# Patient Record
Sex: Female | Born: 2000 | Hispanic: Yes | Marital: Single | Smoking: Never smoker
Health system: Southern US, Community
[De-identification: ages and names within clinical notes are randomized; demographics above are authoritative.]

## PROBLEM LIST (undated history)

## (undated) HISTORY — PX: NO PAST SURGERIES: SHX2092

---
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
## 2018-01-05 ENCOUNTER — Ambulatory Visit: Payer: BLUE CROSS/BLUE SHIELD | Attending: Orthopaedic Surgery | Admitting: Physical Therapy

## 2018-01-05 ENCOUNTER — Encounter: Payer: Self-pay | Admitting: Physical Therapy

## 2018-01-05 ENCOUNTER — Other Ambulatory Visit: Payer: Self-pay

## 2018-01-05 DIAGNOSIS — G8929 Other chronic pain: Secondary | ICD-10-CM

## 2018-01-05 DIAGNOSIS — M545 Low back pain: Secondary | ICD-10-CM | POA: Diagnosis present

## 2018-01-05 NOTE — Therapy (Addendum)
[facility] [place], [place], [address] Phone: [phone]   Fax:  [phone]  Physical Therapy Evaluation/Discharge  Patient Details  Name: Stacy Sullivan MRN: [id number] Date of Birth: December 29, 2000 Referring Provider: Rodell Perna   Encounter Date: 01/05/2018  PT End of Session - 01/05/18 1843    Visit Number  1    Number of Visits  12    Date for PT Re-Evaluation  02/23/18    Authorization Type  Progress note every 10th visit    PT Start Time  1348    PT Stop Time  1430    PT Time Calculation (min)  42 min    Activity Tolerance  Patient tolerated treatment well    Behavior During Therapy  [facility] for tasks assessed/performed       History reviewed. No pertinent past medical history.  Past Surgical History:  Procedure Laterality Date  . NO PAST SURGERIES      There were no vitals filed for this visit.   Subjective Assessment - 01/05/18 1826    Subjective  Patient arrives with sister with reports of insidious low back pain, right > left, that began a couple months ago. Patient reported she has always had lower back pain however patient experienced more pain during gym with exercise and running. Patient reported having chiropractic treatment and massage therapy to which helped temporarily. Patient reports intermittent, quick, sharp pains but reports pain is at a constant 1-2/10. Patient reports pain with standing and walking especially at work. Patient reports also pain with sitting during school. Patient reported doctor told her to not lift heavy objects and take gym lightly until further notice. Patient's goals are to decrease pain in order to perform job duties and sit at school without interruptions of pain.     Patient is accompained by:  Family member Sister    Pertinent History  Scoliosis, Spina bifida occulta    Limitations  Standing;Sitting    How long can you sit comfortably?  1.5 hours    How long can you stand  comfortably?  3-4 hours    Diagnostic tests  x-ray: scoliosis and spina bifida occulta at L5    Patient Stated Goals  get rid of pain, stand without pain.    Currently in Pain?  Yes    Pain Score  2     Pain Location  Back    Pain Orientation  Right;Lower    Pain Descriptors / Indicators  Sore;Sharp    Pain Type  Acute pain    Pain Onset  More than a month ago    Pain Frequency  Intermittent    Aggravating Factors   exercise, running, lifting weights, cold    Pain Relieving Factors  ibuprofen and rest    Effect of Pain on Daily Activities  none         [facility] PT Assessment - 01/05/18 0001      Assessment   Medical Diagnosis  Chronic low back pain    Referring Provider  Rodell Perna    Next MD Visit  June 2019    Prior Therapy  No      Balance Screen   Has the patient fallen in the past 6 months  No    Has the patient had a decrease in activity level because of a fear of falling?   No    Is the patient reluctant to leave their home because of a fear of falling?   No  [place]  Private residence    Living Arrangements  Spouse/significant other;Children    [place] to enter    Entrance Stairs-Number of Steps  10    Entrance Stairs-Rails  Can reach both      Prior Function   Vocation  Student;Part time employment    Vocation Requirements  hostess: walking standing      ROM / Strength   AROM / PROM / Strength  AROM;Strength      AROM   AROM Assessment Site  Lumbar    Lumbar Flexion  can touch toes    Lumbar Extension  limited with right trunk rotation    Lumbar - Right Side Bend  WFL    Lumbar - Left Side [facility]      Strength   Strength Assessment Site  Knee;Hip    Right/Left Hip  Right;Left    Right Hip Flexion  4-/5    Right Hip Extension  4/5    Right Hip ABduction  4+/5    Left Hip Flexion  4-/5    Left Hip Extension  4/5    Left Hip ABduction  4+/5    Right/Left Knee  Right;Left    Right Knee Flexion  4/5     Right Knee Extension  5/5    Left Knee Flexion  4/5    Left Knee Extension  5/5      Palpation   SI assessment   Posteriorly rotated right innonimate    Palpation comment  very tender to palpation along L4-S1 spinous processes, bilateral upper gluteals, bilateral PSIS      Special Tests    Special Tests  Sacrolliac Tests    Sacroiliac Tests   Sacral Thrust      Sacral thrust    Findings  Positive    Comments  some tenderness to thrust      Ambulation/Gait   Gait Pattern  Within Functional Limits                Objective measurements completed on examination: See above findings.              PT Education - 01/05/18 1842    Education provided  Yes    Education Details  draw ins, bridging, standing lat flexion stretch, 3 way prayer stretch    Person(s) Educated  Patient    Methods  Explanation;Demonstration;Handout    Comprehension  Verbalized understanding;Returned demonstration          PT Long Term Goals - 01/05/18 1907      PT LONG TERM GOAL #1   Title  Patient will be independent with HEP.    Time  6    Period  Weeks    Status  New      PT LONG TERM GOAL #2   Title  Patient will demonstrate 5/5 hip strength bilaterally in all planes to improve stability during functional and recreational activities.    Time  6    Period  Weeks    Status  New      PT LONG TERM GOAL #3   Title  Patient will report ablilty to stand/walk for a whole work shift (4-5 hours) with less than 3/10 pain in low back.    Time  6    Period  Weeks    Status  New      PT LONG TERM GOAL #4   Title  Patient will report  ability to exercise and run for at least one hour with less than 3/10 low back pain in order to maintain health and fitness.    Time  6    Period  Weeks    Status  New             Plan - 01/05/18 1859    Clinical Impression Statement  Patient is an 17 year old female who presents to physical therapy with low back pain, right > left. Patient  denies numbness and tingling. Patient demonstrates [facility] lumbar AROM however patient noted with right trunk rotation with lumbar extension. Patient noted good LE strength with no reports of pain. Patient noted with increased lumbar lordosis and forward head and rounded shoulders during sitting and standing. Patient very tender to palpation along L4-S1 spinous processes, bilateral lumbar paraspinals, upper gluteals, and bilateral PSIS. Patient noted with unequal leg lengths and unequal ASIS which may be indicative of SI joint dysfunction. Patient would benefit from skilled physical therapy to address deficits and address patient's goals.     History and Personal Factors relevant to plan of care:  Scoliosis (R curve), Spina bifida occulta at L5    Clinical Presentation  Stable    Clinical Decision Making  Low    Rehab Potential  Good    PT Frequency  2x / week    PT Duration  6 weeks    PT Treatment/Interventions  ADLs/Self Care Home Management;Iontophoresis 59m/ml Dexamethasone;Moist Heat;Electrical Stimulation;Cryotherapy;Ultrasound;Gait training;Stair training;Neuromuscular re-education;Patient/family education;Therapeutic exercise;Therapeutic activities;Taping;Dry needling;Manual techniques;Passive range of motion    PT Next Visit Plan  bike or elliptical, core strengthening, STW/M and modalities to reduce pain    Consulted and Agree with Plan of Care  Patient       Patient will benefit from skilled therapeutic intervention in order to improve the following deficits and impairments:  Pain, Decreased activity tolerance, Decreased endurance, Postural dysfunction  Visit Diagnosis: Chronic bilateral low back pain, with sciatica presence unspecified     Problem List Patient Active Problem List   Diagnosis Date Noted  . Chronic bilateral low back pain without sciatica 11/08/2017  . Spina bifida occulta 11/08/2017  . Scoliosis of lumbar spine 11/08/2017   PHYSICAL THERAPY DISCHARGE  SUMMARY  Visits from Start of Care: 1  Current functional level related to goals / functional outcomes: See above   Remaining deficits: Evaluation only, no goals met   Education / Equipment: HEP Plan: Patient agrees to discharge.  Patient goals were not met. Patient is being discharged due to not returning since the last visit.  ?????       KGabriela Eves PT, DPT 01/05/2018, 7:13 PM  [facility] [address]Phone: [phone]  Fax:  [phone] Name: ILissie HinesleyMRN: [id number] of Birth: 1February 28, 2002

## 2019-07-31 ENCOUNTER — Other Ambulatory Visit: Payer: Self-pay

## 2019-07-31 DIAGNOSIS — Z20822 Contact with and (suspected) exposure to covid-19: Secondary | ICD-10-CM

## 2019-08-02 LAB — NOVEL CORONAVIRUS, NAA: SARS-CoV-2, NAA: DETECTED — AB

## 2021-03-14 ENCOUNTER — Encounter: Payer: Self-pay | Admitting: Nurse Practitioner

## 2021-03-14 ENCOUNTER — Other Ambulatory Visit: Payer: Self-pay

## 2021-03-14 ENCOUNTER — Ambulatory Visit (INDEPENDENT_AMBULATORY_CARE_PROVIDER_SITE_OTHER): Payer: 59 | Admitting: Nurse Practitioner

## 2021-03-14 DIAGNOSIS — Z0001 Encounter for general adult medical examination with abnormal findings: Secondary | ICD-10-CM

## 2021-03-14 DIAGNOSIS — Z23 Encounter for immunization: Secondary | ICD-10-CM | POA: Diagnosis not present

## 2021-03-14 DIAGNOSIS — Z0184 Encounter for antibody response examination: Secondary | ICD-10-CM

## 2021-03-14 DIAGNOSIS — Z0000 Encounter for general adult medical examination without abnormal findings: Secondary | ICD-10-CM

## 2021-03-14 NOTE — Assessment & Plan Note (Signed)
Completed test results pending.

## 2021-03-14 NOTE — Assessment & Plan Note (Addendum)
Completed physical exam head to toe assessment.  No new concerns.  Provided education to patient with printed handouts given on health maintenance and preventative care.  Patient verbalized understanding.  Follow-up in 1 year.  Labs completed-CBC, CMP, lipid panel, results pending.  All school forms completed and signed.

## 2021-03-14 NOTE — Patient Instructions (Signed)
Health Maintenance, Female Adopting a healthy lifestyle and getting preventive care are important in promoting health and wellness. Ask your health care provider about: The right schedule for you to have regular tests and exams. Things you can do on your own to prevent diseases and keep yourself healthy. What should I know about diet, weight, and exercise? Eat a healthy diet  Eat a diet that includes plenty of vegetables, fruits, low-fat dairy products, and lean protein. Do not eat a lot of foods that are high in solid fats, added sugars, or sodium.  Maintain a healthy weight Body mass index (BMI) is used to identify weight problems. It estimates body fat based on height and weight. Your health care provider can help determineyour BMI and help you achieve or maintain a healthy weight. Get regular exercise Get regular exercise. This is one of the most important things you can do for your health. Most adults should: Exercise for at least 150 minutes each week. The exercise should increase your heart rate and make you sweat (moderate-intensity exercise). Do strengthening exercises at least twice a week. This is in addition to the moderate-intensity exercise. Spend less time sitting. Even light physical activity can be beneficial. Watch cholesterol and blood lipids Have your blood tested for lipids and cholesterol at 22 years of age, then havethis test every 5 years. Have your cholesterol levels checked more often if: Your lipid or cholesterol levels are high. You are older than 22 years of age. You are at high risk for heart disease. What should I know about cancer screening? Depending on your health history and family history, you may need to have cancer screening at various ages. This may include screening for: Breast cancer. Cervical cancer. Colorectal cancer. Skin cancer. Lung cancer. What should I know about heart disease, diabetes, and high blood pressure? Blood pressure and heart  disease High blood pressure causes heart disease and increases the risk of stroke. This is more likely to develop in people who have high blood pressure readings, are of African descent, or are overweight. Have your blood pressure checked: Every 3-5 years if you are 18-39 years of age. Every year if you are 40 years old or older. Diabetes Have regular diabetes screenings. This checks your fasting blood sugar level. Have the screening done: Once every three years after age 40 if you are at a normal weight and have a low risk for diabetes. More often and at a younger age if you are overweight or have a high risk for diabetes. What should I know about preventing infection? Hepatitis B If you have a higher risk for hepatitis B, you should be screened for this virus. Talk with your health care provider to find out if you are at risk forhepatitis B infection. Hepatitis C Testing is recommended for: Everyone born from 1945 through 1965. Anyone with known risk factors for hepatitis C. Sexually transmitted infections (STIs) Get screened for STIs, including gonorrhea and chlamydia, if: You are sexually active and are younger than 22 years of age. You are older than 22 years of age and your health care provider tells you that you are at risk for this type of infection. Your sexual activity has changed since you were last screened, and you are at increased risk for chlamydia or gonorrhea. Ask your health care provider if you are at risk. Ask your health care provider about whether you are at high risk for HIV. Your health care provider may recommend a prescription medicine to help   prevent HIV infection. If you choose to take medicine to prevent HIV, you should first get tested for HIV. You should then be tested every 3 months for as long as you are taking the medicine. Pregnancy If you are about to stop having your period (premenopausal) and you may become pregnant, seek counseling before you get  pregnant. Take 400 to 800 micrograms (mcg) of folic acid every day if you become pregnant. Ask for birth control (contraception) if you want to prevent pregnancy. Osteoporosis and menopause Osteoporosis is a disease in which the bones lose minerals and strength with aging. This can result in bone fractures. If you are 65 years old or older, or if you are at risk for osteoporosis and fractures, ask your health care provider if you should: Be screened for bone loss. Take a calcium or vitamin D supplement to lower your risk of fractures. Be given hormone replacement therapy (HRT) to treat symptoms of menopause. Follow these instructions at home: Lifestyle Do not use any products that contain nicotine or tobacco, such as cigarettes, e-cigarettes, and chewing tobacco. If you need help quitting, ask your health care provider. Do not use street drugs. Do not share needles. Ask your health care provider for help if you need support or information about quitting drugs. Alcohol use Do not drink alcohol if: Your health care provider tells you not to drink. You are pregnant, may be pregnant, or are planning to become pregnant. If you drink alcohol: Limit how much you use to 0-1 drink a day. Limit intake if you are breastfeeding. Be aware of how much alcohol is in your drink. In the [place] one drink equals one 12 oz bottle of beer (355 mL), one 5 oz glass of wine (148 mL), or one 1 oz glass of hard liquor (44 mL). General instructions Schedule regular health, dental, and eye exams. Stay current with your vaccines. Tell your health care provider if: You often feel depressed. You have ever been abused or do not feel safe at home. Summary Adopting a healthy lifestyle and getting preventive care are important in promoting health and wellness. Follow your health care provider's instructions about healthy diet, exercising, and getting tested or screened for diseases. Follow your health care provider's  instructions on monitoring your cholesterol and blood pressure. This information is not intended to replace advice given to you by your health care provider. Make sure you discuss any questions you have with your healthcare provider. Document Revised: 08/17/2018 Document Reviewed: 08/17/2018 Elsevier Patient Education  2022 Elsevier Inc.  

## 2021-03-14 NOTE — Progress Notes (Addendum)
New Patient Note  RE: Tyliyah Mcmeekin MRN: [id number] DOB: [birth year] Date of Office Visit: 03/14/2021  Chief Complaint: Annual Exam  History of Present Illness: .   Encounter for general adult medical examination without abnormal findings  Physical: Patient's last physical exam was 1 year ago .  Weight: Appropriate for height (BMI less than 27%) ; yes Blood Pressure: Normal (BP less than 120/80) ; yes Medical History: Patient history reviewed ; Family history reviewed ;  Allergies Reviewed: No change in current allergies ; no changes Medications Reviewed: Medications reviewed - no changes ;  Lipids: Normal lipid levels ; labs completed results pending Smoking: Life-long non-smoker ; non-smoker Physical Activity: Exercises at least 3 times per week ;  Alcohol/Drug Use: Is a non-drinker ; No illicit drug use ; no drug use Patient is not afflicted from Stress Incontinence and Urge Incontinence  Safety: reviewed ; Patient wears a seat belt, has smoke detectors, has carbon monoxide detectors, practices appropriate gun safety, and wears sunscreen with extended sun exposure. Dental Care: biannual cleanings, brushes and flosses daily. Ophthalmology/Optometry: Annual visit.  Hearing loss: none Vision impairments: none  Assessment and Plan: Alfredo is a 20 y.o. female with: Annual physical exam Completed physical exam head to toe assessment.  No new concerns.  Provided education to patient with printed handouts given on health maintenance and preventative care.  Patient verbalized understanding.  Follow-up in 1 year.  Labs completed-CBC, CMP, lipid panel, results pending.  All school forms completed and signed.  Encounter for antibody response examination Completed test results pending.   Return in about 1 year (around 03/14/2022).   Diagnostics:   Past Medical History: Patient Active Problem List   Diagnosis Date Noted   Annual physical exam 03/14/2021   Encounter for  antibody response examination 03/14/2021   Chronic bilateral low back pain without sciatica 11/08/2017   Spina bifida occulta 11/08/2017   Scoliosis of lumbar spine 11/08/2017   History reviewed. No pertinent past medical history. Past Surgical History: Past Surgical History:  Procedure Laterality Date   NO PAST SURGERIES     Medication List:  Current Outpatient Medications  Medication Sig Dispense Refill   meloxicam (MOBIC) 7.5 MG tablet TAKE 1 TABLET BY MOUTH EVERY DAY (Patient not taking: Reported on 01/05/2018) 30 tablet 0   No current facility-administered medications for this visit.   Allergies: No Known Allergies Social History: Social History   Socioeconomic History   Marital status: Single    Spouse name: Not on file   Number of children: Not on file   Years of education: Not on file   Highest education level: Not on file  Occupational History   Not on file  Tobacco Use   Smoking status: Never   Smokeless tobacco: Never  Vaping Use   Vaping Use: Never used  Substance and Sexual Activity   Alcohol use: No   Drug use: No   Sexual activity: Never    Birth control/protection: Abstinence  Other Topics Concern   Not on file  Social History Narrative   Not on file   Social Determinants of Health   Financial Resource Strain: Not on file  Food Insecurity: Not on file  Transportation Needs: Not on file  Physical Activity: Not on file  Stress: Not on file  Social Connections: Not on file       Family History: Family History  Problem Relation Age of Onset   Healthy Mother    Healthy Father  Healthy Sister    Healthy Brother    Healthy Sister    Healthy Brother    Healthy Brother    Ankylosing spondylitis Neg Hx    Rheum arthritis Neg Hx    Juvenile idiopathic arthritis Neg Hx      Review of Systems  Constitutional: Negative.   HENT: Negative.    Eyes: Negative.   Respiratory: Negative.    Cardiovascular: Negative.   Gastrointestinal:  Negative.   Musculoskeletal: Negative.   Skin: Negative.  Negative for rash.  Neurological: Negative.   All other systems reviewed and are negative. Objective: There were no vitals taken for this visit. There is no height or weight on file to calculate BMI. Physical Exam Vitals and nursing note reviewed.  Constitutional:      Appearance: Normal appearance. She is normal weight.  HENT:     Head: Normocephalic.     Nose: Nose normal.     Mouth/Throat:     Mouth: Mucous membranes are moist.     Pharynx: Oropharynx is clear.  Eyes:     Conjunctiva/sclera: Conjunctivae normal.     Pupils: Pupils are equal, round, and reactive to light.  Cardiovascular:     Rate and Rhythm: Normal rate and regular rhythm.     Pulses: Normal pulses.     Heart sounds: Normal heart sounds.  Pulmonary:     Effort: Pulmonary effort is normal.     Breath sounds: Normal breath sounds.  Abdominal:     General: Abdomen is flat. Bowel sounds are normal.  Musculoskeletal:        General: Normal range of motion.  Skin:    General: Skin is warm.     Findings: No rash.  Neurological:     Mental Status: She is alert and oriented to person, place, and time.  Psychiatric:        Behavior: Behavior normal.   Annual physical exam Completed physical exam head to toe assessment.  No new concerns.  Provided education to patient with printed handouts given on health maintenance and preventative care.  Patient verbalized understanding.  Follow-up in 1 year.  Labs completed-CBC, CMP, lipid panel, results pending.  All school forms completed and signed.  Encounter for antibody response examination Completed test results pending.      The plan was reviewed with the patient/family, and all questions/concerned were addressed.  It was my pleasure to see Dimitria today and participate in her care. Please feel free to contact me with any questions or concerns.  Sincerely,  Lynnell Chad NP [facility]

## 2021-03-19 ENCOUNTER — Other Ambulatory Visit: Payer: Self-pay

## 2021-03-19 ENCOUNTER — Ambulatory Visit (INDEPENDENT_AMBULATORY_CARE_PROVIDER_SITE_OTHER): Payer: 59 | Admitting: *Deleted

## 2021-03-19 DIAGNOSIS — Z23 Encounter for immunization: Secondary | ICD-10-CM

## 2021-03-20 LAB — CBC WITH DIFFERENTIAL/PLATELET
Basophils Absolute: 0.1 10*3/uL (ref 0.0–0.2)
Basos: 1 %
EOS (ABSOLUTE): 0.1 10*3/uL (ref 0.0–0.4)
Eos: 2 %
Hematocrit: 44.5 % (ref 34.0–46.6)
Hemoglobin: 14.5 g/dL (ref 11.1–15.9)
Immature Grans (Abs): 0 10*3/uL (ref 0.0–0.1)
Immature Granulocytes: 0 %
Lymphocytes Absolute: 1.9 10*3/uL (ref 0.7–3.1)
Lymphs: 30 %
MCH: 28.7 pg (ref 26.6–33.0)
MCHC: 32.6 g/dL (ref 31.5–35.7)
MCV: 88 fL (ref 79–97)
Monocytes Absolute: 0.5 10*3/uL (ref 0.1–0.9)
Monocytes: 7 %
Neutrophils Absolute: 3.8 10*3/uL (ref 1.4–7.0)
Neutrophils: 60 %
Platelets: 333 10*3/uL (ref 150–450)
RBC: 5.06 x10E6/uL (ref 3.77–5.28)
RDW: 13.1 % (ref 11.7–15.4)
WBC: 6.4 10*3/uL (ref 3.4–10.8)

## 2021-03-20 LAB — COMPREHENSIVE METABOLIC PANEL
ALT: 11 IU/L (ref 0–32)
AST: 18 IU/L (ref 0–40)
Albumin/Globulin Ratio: 1.6 (ref 1.2–2.2)
Albumin: 4.6 g/dL (ref 3.9–5.0)
Alkaline Phosphatase: 65 IU/L (ref 44–121)
BUN/Creatinine Ratio: 15 (ref 9–23)
BUN: 10 mg/dL (ref 6–20)
Bilirubin Total: 0.2 mg/dL (ref 0.0–1.2)
CO2: 24 mmol/L (ref 20–29)
Calcium: 10 mg/dL (ref 8.7–10.2)
Chloride: 103 mmol/L (ref 96–106)
Creatinine, Ser: 0.68 mg/dL (ref 0.57–1.00)
Globulin, Total: 2.8 g/dL (ref 1.5–4.5)
Glucose: 85 mg/dL (ref 65–99)
Potassium: 5.2 mmol/L (ref 3.5–5.2)
Sodium: 141 mmol/L (ref 134–144)
Total Protein: 7.4 g/dL (ref 6.0–8.5)
eGFR: 127 mL/min/{1.73_m2} (ref >59)

## 2021-03-20 LAB — QUANTIFERON-TB GOLD PLUS
QuantiFERON Mitogen Value: >10 IU/mL
QuantiFERON Nil Value: 0 IU/mL
QuantiFERON TB1 Ag Value: 0.02 IU/mL
QuantiFERON TB2 Ag Value: 0.03 IU/mL
QuantiFERON-TB Gold Plus: NEGATIVE

## 2021-03-20 LAB — LIPID PANEL
Chol/HDL Ratio: 3.9 ratio (ref 0.0–4.4)
Cholesterol, Total: 179 mg/dL (ref 100–199)
HDL: 46 mg/dL (ref >39)
LDL Chol Calc (NIH): 120 mg/dL — ABNORMAL HIGH (ref 0–99)
Triglycerides: 67 mg/dL (ref 0–149)
VLDL Cholesterol Cal: 13 mg/dL (ref 5–40)

## 2021-03-20 LAB — MEASLES/MUMPS/RUBELLA IMMUNITY
MUMPS ABS, IGG: 24.2 AU/mL (ref >10.9)
RUBEOLA AB, IGG: <13.5 AU/mL — ABNORMAL LOW (ref >16.4)
Rubella Antibodies, IGG: 1.01 index (ref >0.99)

## 2021-03-20 LAB — VARICELLA ZOSTER ANTIBODY, IGG: Varicella zoster IgG: 264 index (ref >165)

## 2021-12-30 ENCOUNTER — Encounter: Payer: Self-pay | Admitting: Nurse Practitioner

## 2021-12-30 ENCOUNTER — Ambulatory Visit (INDEPENDENT_AMBULATORY_CARE_PROVIDER_SITE_OTHER): Payer: 59 | Admitting: Nurse Practitioner

## 2021-12-30 ENCOUNTER — Ambulatory Visit (INDEPENDENT_AMBULATORY_CARE_PROVIDER_SITE_OTHER): Payer: 59

## 2021-12-30 ENCOUNTER — Other Ambulatory Visit: Payer: Self-pay | Admitting: Nurse Practitioner

## 2021-12-30 VITALS — BP 99/63 | HR 65 | Temp 98.1°F | Resp 20 | Ht 66.0 in | Wt 141.0 lb

## 2021-12-30 DIAGNOSIS — R110 Nausea: Secondary | ICD-10-CM

## 2021-12-30 DIAGNOSIS — R1032 Left lower quadrant pain: Secondary | ICD-10-CM

## 2021-12-30 DIAGNOSIS — A09 Infectious gastroenteritis and colitis, unspecified: Secondary | ICD-10-CM | POA: Diagnosis not present

## 2021-12-30 DIAGNOSIS — R300 Dysuria: Secondary | ICD-10-CM

## 2021-12-30 DIAGNOSIS — N926 Irregular menstruation, unspecified: Secondary | ICD-10-CM | POA: Diagnosis not present

## 2021-12-30 LAB — MICROSCOPIC EXAMINATION: Renal Epithel, UA: NONE SEEN /hpf

## 2021-12-30 LAB — URINALYSIS, ROUTINE W REFLEX MICROSCOPIC
Bilirubin, UA: NEGATIVE
Glucose, UA: NEGATIVE
Ketones, UA: NEGATIVE
Nitrite, UA: NEGATIVE
Protein,UA: NEGATIVE
Specific Gravity, UA: 1.025 (ref 1.005–1.030)
Urobilinogen, Ur: 0.2 mg/dL (ref 0.2–1.0)
pH, UA: 5 (ref 5.0–7.5)

## 2021-12-30 LAB — PREGNANCY, URINE: Preg Test, Ur: NEGATIVE

## 2021-12-30 MED ORDER — ONDANSETRON HCL 4 MG PO TABS: 4.0000 mg | ORAL_TABLET | Freq: Three times a day (TID) | ORAL | 0 refills | Status: AC | PRN

## 2021-12-30 MED ORDER — NITROFURANTOIN MONOHYD MACRO 100 MG PO CAPS: 100.0000 mg | ORAL_CAPSULE | Freq: Two times a day (BID) | ORAL | 0 refills | Status: AC

## 2021-12-30 MED ORDER — LOPERAMIDE HCL 2 MG PO TABS: 2.0000 mg | ORAL_TABLET | Freq: Four times a day (QID) | ORAL | 0 refills | Status: AC | PRN

## 2021-12-30 NOTE — Addendum Note (Signed)
Addended by: Magdalene River on: 12/30/2021 03:23 PM ? ? Modules accepted: Orders ? ?

## 2021-12-30 NOTE — Patient Instructions (Addendum)
Ovarian Cyst ?Abnormal Uterine Bleeding ? ?Abnormal uterine bleeding is unusual bleeding from the uterus. It includes bleeding after sex, or bleeding or spotting between menstrual periods. It may also include bleeding that is heavier than normal, menstrual periods that last longer than usual, or bleeding that occurs after menopause. ?Abnormal uterine bleeding can affect teenagers, women in their reproductive years, pregnant women, and women who have reached menopause. Common causes of abnormal uterine bleeding include: ?Pregnancy. ?Abnormal growths within the lining of the uterus (polyps). ?Benign tumors or growths in the uterus (fibroids). These are not cancer. ?Infection. ?Cancer. ?Too much or too little of some hormones in the body (hormonal imbalances). ?Any type of abnormal bleeding should be checked by a health care provider. Many cases are minor and simple to treat, but others may be more serious. Treatment will depend on the cause of the bleeding and how severe it is. ?Follow these instructions at home: ?Medicines ?Take over-the-counter and prescription medicines only as told by your health care provider. ?Ask your health care provider about: ?Taking medicines such as aspirin and ibuprofen. These medicines can thin your blood. Do not take these medicines unless your health care provider tells you to take them. ?Taking over-the-counter medicines, vitamins, herbs, and supplements. ?If you were prescribed iron pills, take them as told by your health care provider. Iron pills help to replace iron that your body loses because of this condition. ?Managing constipation ?In cases of severe bleeding, you may be asked to increase your iron intake to treat anemia. Doing this may cause constipation. To prevent or treat constipation, you may need to: ?Drink enough fluid to keep your urine pale yellow. ?Take over-the-counter or prescription medicines. ?Eat foods that are high in fiber, such as beans, whole grains, and  fresh fruits and vegetables. ?Limit foods that are high in fat and processed sugars, such as fried or sweet foods. ?Activity ?Alter your activity to decrease bleeding if you need to change your sanitary pad more than one time every 2 hours: ?Lie in bed with your feet raised (elevated). ?Place a cold pack on your lower abdomen. ?Rest as much as possible until the bleeding stops or slows down. ?General instructions ?Do not use tampons, douche, or have sex until your health care provider says these things are okay. ?Change your sanitary pads often. ?Get regular exams. These include pelvic exams and cervical cancer screenings. ?It is up to you to get the results of any tests that are done. Ask your health care provider, or the department that is doing the tests, when your results will be ready. ?Monitor your condition for any changes. For 2 months, write down: ?When your menstrual period starts. ?When your menstrual period ends. ?When any abnormal vaginal bleeding occurs. ?What problems you notice. ?Keep all follow-up visits. This is important. ?Contact a health care provider if: ?You have bleeding that lasts for more than one week. ?You feel dizzy at times. ?You feel nauseous or you vomit. ?You feel light-headed or weak. ?You notice any other changes that show that your condition is getting worse. ?Get help right away if: ?You faint. ?You have bleeding that soaks through a sanitary pad every hour. ?You have pain in the abdomen. ?You have a fever or chills. ?You become sweaty or weak. ?You pass large blood clots from your vagina. ?These symptoms may represent a serious problem that is an emergency. Do not wait to see if the symptoms will go away. Get medical help right away. Call your local  emergency services (911 in the [place] Do not drive yourself to the hospital. ?Summary ?Abnormal uterine bleeding is unusual bleeding from the uterus. ?Any type of abnormal bleeding should be checked by a health care provider. Many  cases are minor and simple to treat, but others may be more serious. ?Treatment will depend on the cause of the bleeding and how severe it is. ?Get help right away if you faint, you have bleeding that soaks through a sanitary pad every hour, or you pass large blood clots from your vagina. ?This information is not intended to replace advice given to you by your health care provider. Make sure you discuss any questions you have with your health care provider. ?Document Revised: 12/24/2020 Document Reviewed: 12/24/2020 ?Elsevier Patient Education ? 2023 Elsevier Inc. ? ?An ovarian cyst is a fluid-filled sac that forms on an ovary. The ovaries are small organs that produce eggs in women. Various types of cysts can form on the ovaries. Some may cause symptoms and require treatment. Most ovarian cysts go away on their own, are not cancerous (are benign), and do not cause problems. ?What are the causes? ?Ovarian cysts may be caused by: ?Ovarian hyperstimulation syndrome. This is a condition that can develop from taking fertility medicines. It causes multiple large ovarian cysts to form. ?Polycystic ovarian syndrome (PCOS). This is a common hormonal disorder that can cause ovarian cysts to form, and can cause problems with your period or fertility. ?The normal menstrual cycle. ?What increases the risk? ?The following factors may make you more likely to develop this condition: ?Being overweight or obese. ?Taking fertility medicines. ?Taking certain forms of hormonal birth control. ?Smoking. ?What are the signs or symptoms? ?Many ovarian cysts do not cause symptoms. If symptoms are present, they may include: ?Pelvic pain or pressure. ?Pain in the lower abdomen. ?Pain during sex. ?Abdominal swelling. ?Abnormal menstrual periods. ?Increasing pain with menstrual periods. ?How is this diagnosed? ?These cysts are commonly found during a routine pelvic exam. You may have tests to find out more about the cyst, such  as: ?Ultrasound. ?CT scan. ?MRI. ?Blood tests. ?How is this treated? ?Many ovarian cysts go away on their own without treatment. Your health care provider may want to check your cyst regularly for 2-3 months to see if it changes. If you are in menopause, it is especially important to have your cyst monitored closely because menopausal women have a higher rate of ovarian cancer. ?When treatment is needed, it may include: ?Medicines to help relieve pain. ?A procedure to drain the cyst (aspiration). ?Surgery to remove the whole cyst (cystectomy). ?Hormone treatment or birth control pills. These methods are sometimes used to help keep cysts from coming back. ?Surgery to remove the ovary (oophorectomy). ?Follow these instructions at home: ?Take over-the-counter and prescription medicines only as told by your health care provider. ?Ask your health care provider if any medicine prescribed to you requires you to avoid driving or using machinery. ?Get regular pelvic exams and Pap tests as often as told by your health care provider. ?Return to your normal activities as told by your health care provider. Ask your health care provider what activities are safe for you. ?Do not use any products that contain nicotine or tobacco, such as cigarettes, e-cigarettes, and chewing tobacco. If you need help quitting, ask your health care provider. ?Keep all follow-up visits. This is important. ?Contact a health care provider if: ?Your periods are late, irregular, painful, or they stop. ?You have pelvic pain that does not  go away. ?You have pressure on your bladder or trouble emptying your bladder completely. ?You have any of the following: ?A feeling of fullness. ?You are gaining weight or losing weight without changing your exercise and eating habits. ?Pain, swelling, or bloating in the abdomen. ?Loss of appetite. ?Pain and pressure in your back and pelvis. ?You think you may be pregnant. ?Get help right away if: ?You have abdominal or  pelvic pain that is severe or gets worse. ?You cannot eat or drink without vomiting. ?You suddenly develop a fever or chills. ?Your menstrual period is much heavier than usual. ?Summary ?An ovarian cyst is a fluid-filled Pharmacist, hospital

## 2021-12-30 NOTE — Progress Notes (Signed)
? ? ?Acute Office Visit ? ?Subjective:  ? ?  ?Patient ID: Stacy Sullivan, female    DOB: [birth year], 21 y.o.   MRN: [id number] ? ?Chief Complaint  ?Patient presents with  ? Abdominal Pain  ?  Gi upset and recent menstrual changes   ? ? ?Abdominal Pain ?This is a recurrent problem. The current episode started in the past 7 days. The problem occurs constantly. The problem is unchanged. The pain is located in the generalized abdominal region. The pain is moderate. The quality of the pain is described as aching and cramping. The pain radiates to the RLQ and RUQ. Associated symptoms include diarrhea and nausea. Pertinent negatives include no anorexia, constipation, fever, rash or vomiting. Nothing relieves the symptoms. Past treatments include nothing. The treatment provided no improvement relief.  ?Diarrhea  ?This is a new problem. The current episode started in the past 7 days. The problem occurs 2 to 4 times per day. The problem has been unchanged. The stool consistency is described as Watery. The patient states that diarrhea does not awaken her from sleep. Associated symptoms include abdominal pain. Pertinent negatives include no chills, fever or vomiting. Nothing aggravates the symptoms. She has tried nothing for the symptoms. There is no history of bowel resection, inflammatory bowel disease or irritable bowel syndrome.  ? ? ?Review of Systems  ?Constitutional:  Negative for chills and fever.  ?HENT: Negative.    ?Respiratory: Negative.    ?Cardiovascular: Negative.   ?Gastrointestinal:  Positive for abdominal pain, diarrhea and nausea. Negative for anorexia, constipation and vomiting.  ?Skin: Negative.  Negative for rash.  ?All other systems reviewed and are negative. ? ? ?   ?Objective:  ?  ?BP 99/63   Pulse 65   Temp 98.1 ?F (36.7 ?C)   Resp 20   Ht 5\' 6"  (1.676 m)   Wt 141 lb (64 kg)   LMP 12/24/2021 (Exact Date)   SpO2 100%   BMI 22.76 kg/m?  ?BP Readings from Last 3 Encounters:  ?12/30/21 99/63   ?11/18/17 138/77  ?11/08/17 112/68  ? ?Wt Readings from Last 3 Encounters:  ?12/30/21 141 lb (64 kg)  ?11/18/17 145 lb (65.8 kg) (79 %, Z= 0.81)*  ?11/08/17 145 lb (65.8 kg) (79 %, Z= 0.81)*  ? ?* Growth percentiles are based on CDC (Girls, 2-20 Years) data.  ? ?  ? ?Physical Exam ?Vitals and nursing note reviewed.  ?Constitutional:   ?   Appearance: She is well-developed.  ?HENT:  ?   Head: Normocephalic.  ?Cardiovascular:  ?   Rate and Rhythm: Normal rate and regular rhythm.  ?   Heart sounds: Normal heart sounds.  ?Pulmonary:  ?   Effort: Pulmonary effort is normal.  ?   Breath sounds: Normal breath sounds.  ?Abdominal:  ?   General: Bowel sounds are increased.  ?   Tenderness: There is abdominal tenderness in the right upper quadrant and right lower quadrant. There is no right CVA tenderness, left CVA tenderness or rebound. Negative signs include Murphy's sign and McBurney's sign.  ?Neurological:  ?   Mental Status: She is alert.  ? ? ?No results found for any visits on 12/30/21. ? ? ?   ?Assessment & Plan:  ?Bilateral lower abdominal pain symptoms not well controlled in the past 1 week.  Patient reports nausea and vomiting no fever or chills.  Completed urinalysis, urine pregnancy test results pending, transvaginal ultrasound to rule out ovarian cyst and inflammation, KUB with results pending.  Completed urine pregnancy test. ? ?Brat diet recommended, Tylenol/ibuprofen for pain as needed, increase hydration, Zofran for nausea, Imodium for diarrhea.  Follow-up with worsening unresolved symptoms. ?Problem List Items Addressed This Visit   ?None ?Visit Diagnoses   ? ? Left lower quadrant abdominal pain    -  Primary  ? Relevant Medications  ? loperamide (IMODIUM A-D) 2 MG tablet  ? Other Relevant Orders  ? DG Abd 1 View  ? Pregnancy, urine  ? Urinalysis, Routine w reflex microscopic  ? US Transvaginal Non-OB  ? Nausea      ? Relevant Medications  ? ondansetron (ZOFRAN) 4 MG tablet  ? Other Relevant Orders  ?  Urinalysis, Routine w reflex microscopic  ? Diarrhea of infectious origin      ? Relevant Medications  ? loperamide (IMODIUM A-D) 2 MG tablet  ? Menstruation, irregular      ? ?  ? ? ?Meds ordered this encounter  ?Medications  ? ondansetron (ZOFRAN) 4 MG tablet  ?  Sig: Take 1 tablet (4 mg total) by mouth every 8 (eight) hours as needed for nausea or vomiting.  ?  Dispense:  20 tablet  ?  Refill:  0  ?  Order Specific Question:   Supervising Provider  ?  AnswerMechele Claude [[id number]]  ? loperamide (IMODIUM A-D) 2 MG tablet  ?  Sig: Take 1 tablet (2 mg total) by mouth 4 (four) times daily as needed for diarrhea or loose stools.  ?  Dispense:  30 tablet  ?  Refill:  0  ?  Order Specific Question:   Supervising Provider  ?  AnswerMechele Claude [[id number]]  ? ? ?Return if symptoms worsen or fail to improve. ? ?Daryll Drown, NP ? ? ?

## 2022-01-01 LAB — URINE CULTURE: Organism ID, Bacteria: NO GROWTH

## 2022-01-08 ENCOUNTER — Ambulatory Visit (HOSPITAL_COMMUNITY)
Admission: RE | Admit: 2022-01-08 | Discharge: 2022-01-08 | Disposition: A | Discharge status: Home / Self Care | Payer: 59 | Source: Ambulatory Visit | Attending: Nurse Practitioner | Admitting: Nurse Practitioner

## 2022-01-08 DIAGNOSIS — R1032 Left lower quadrant pain: Secondary | ICD-10-CM | POA: Insufficient documentation

## 2022-01-09 ENCOUNTER — Other Ambulatory Visit (HOSPITAL_COMMUNITY): Payer: BLUE CROSS/BLUE SHIELD

## 2022-10-05 ENCOUNTER — Telehealth: Payer: Self-pay | Admitting: Nurse Practitioner

## 2023-02-02 IMAGING — US US TRANSVAGINAL NON-OB
1 series · 14 of 25 positions shown · non-contrast
Comparison: None Available.

CLINICAL DATA: Right lower quadrant to right upper quadrant pain
for 2 weeks

EXAM:
ULTRASOUND PELVIS TRANSVAGINAL
TECHNIQUE: Transvaginal ultrasound examination of the pelvis was performed
including evaluation of the uterus, ovaries, adnexal regions, and
pelvic cul-de-sac.

[Series 1: us pelvis transvaginal non-ob (tv only) · 14 of 81 slices shown]
[im 1/81]
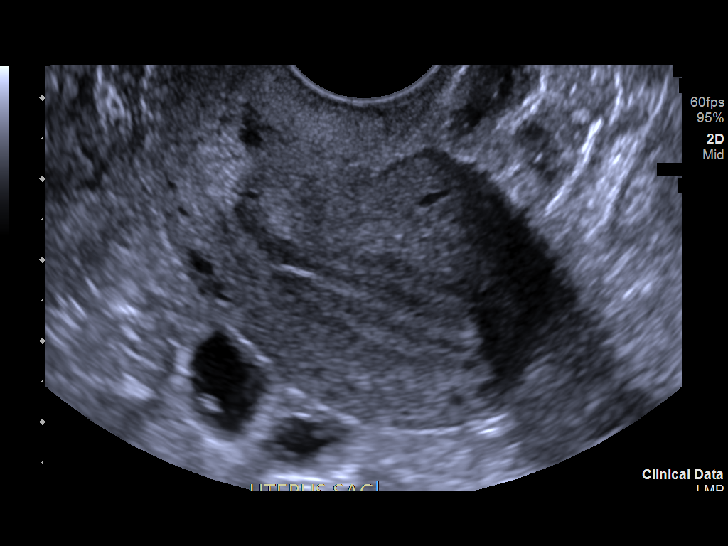
[im 7/81]
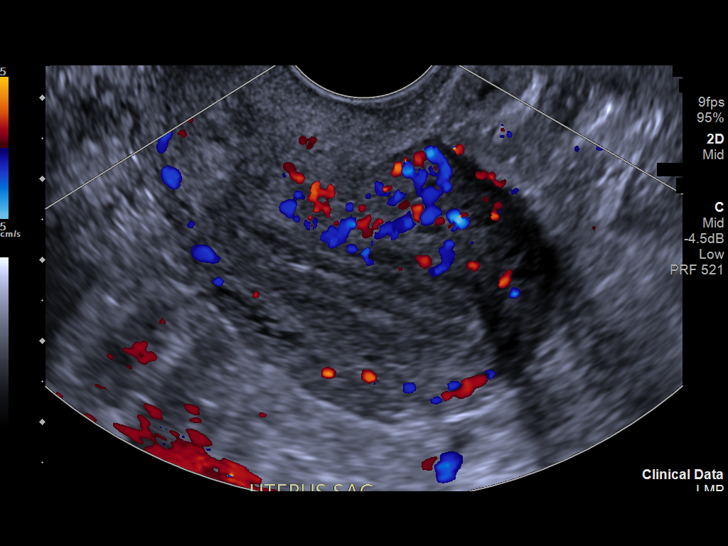
[im 14/81]
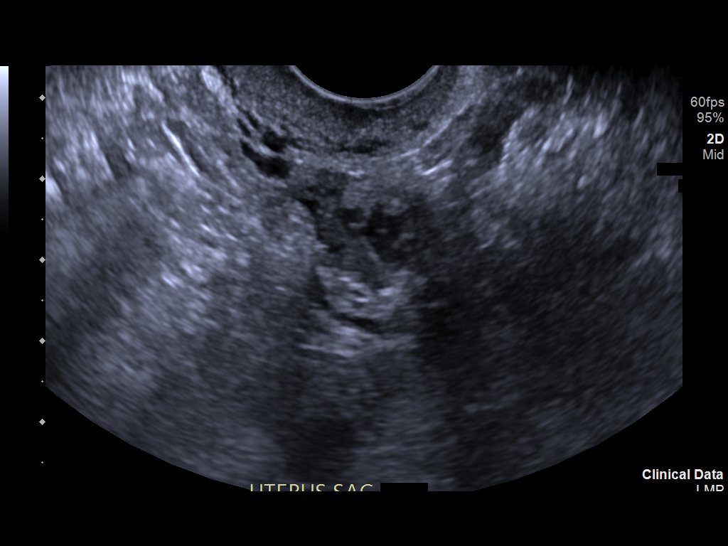
[im 21/81]
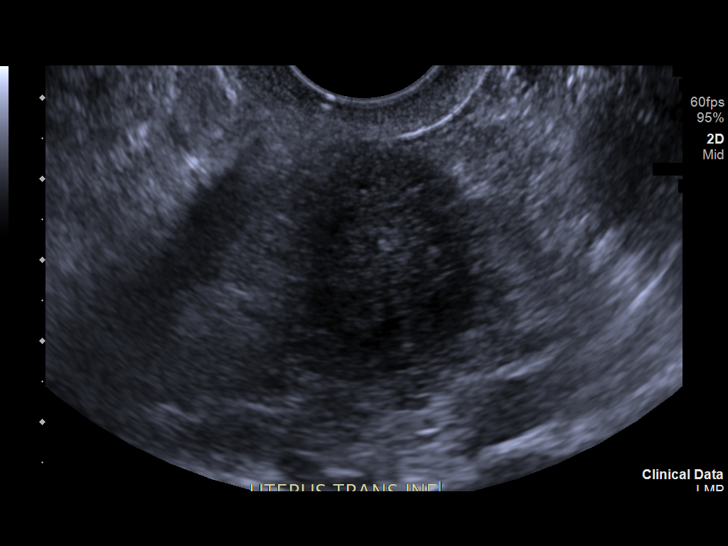
[im 27/81]
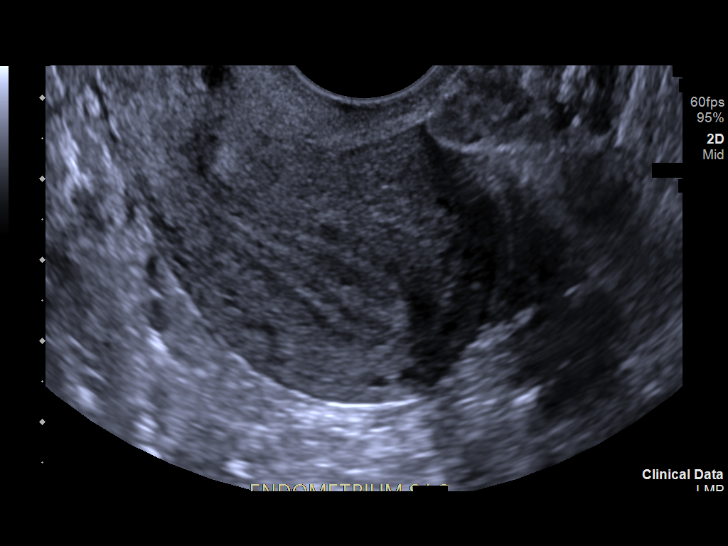
[im 31/81]
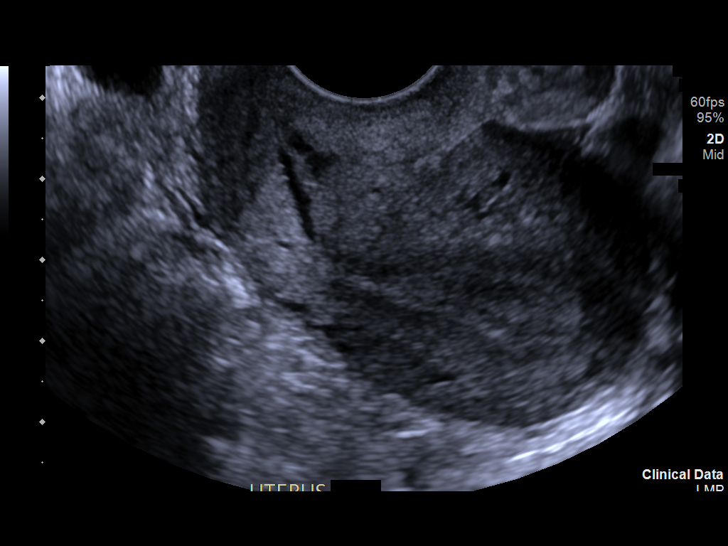
[im 37/81]
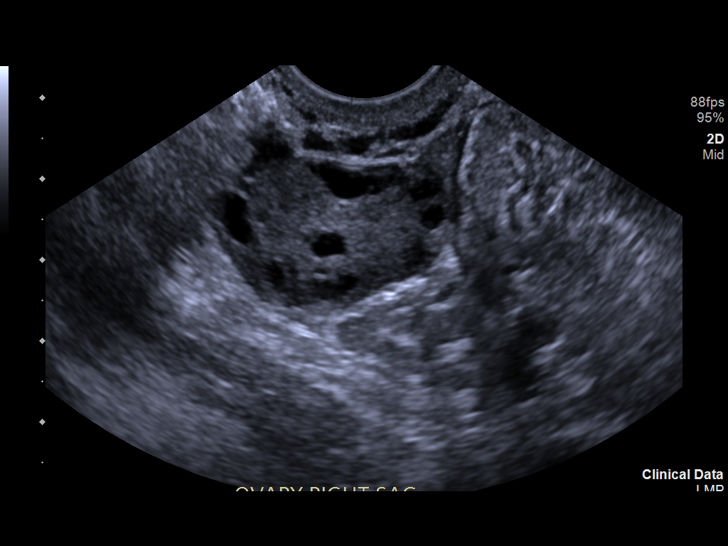
[im 44/81]
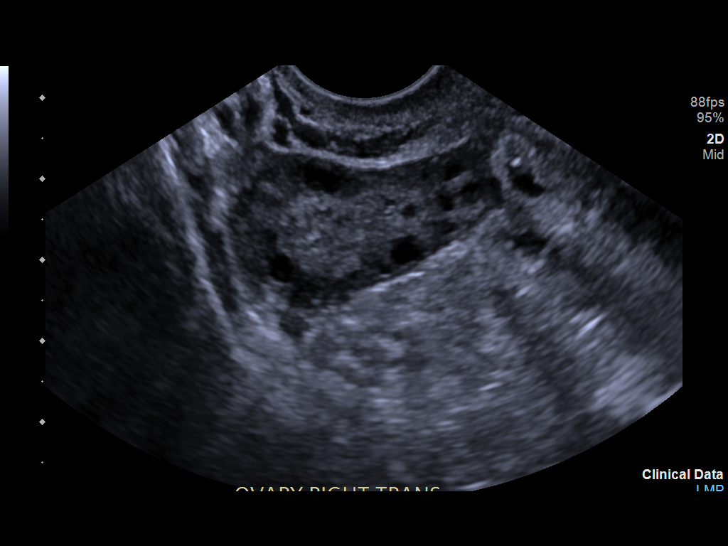
[im 51/81]
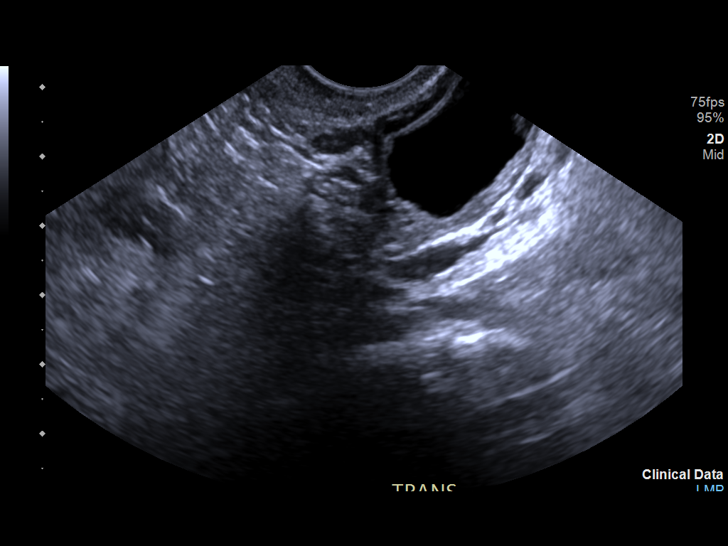
[im 54/81]
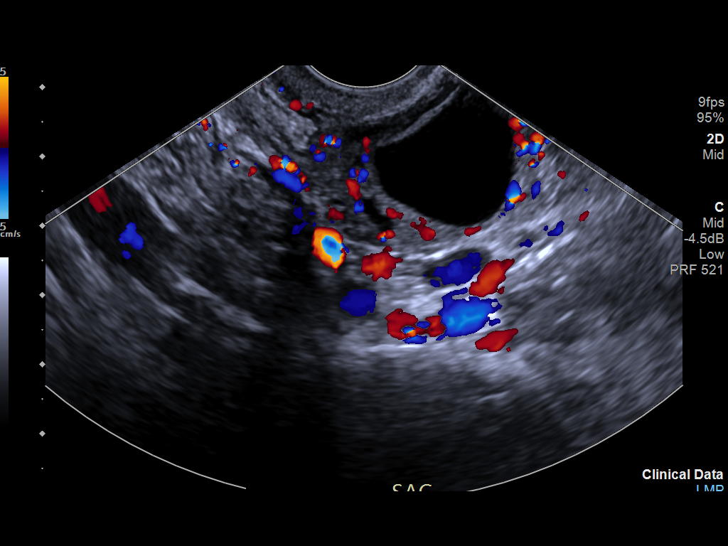
[im 61/81]
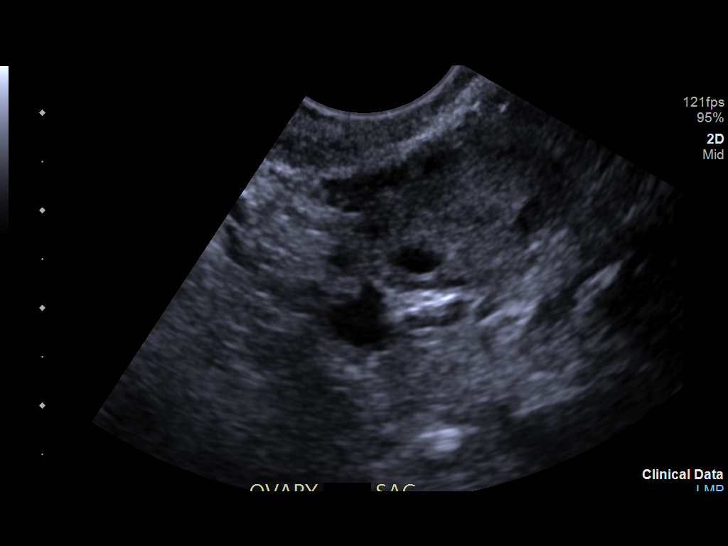
[im 67/81]
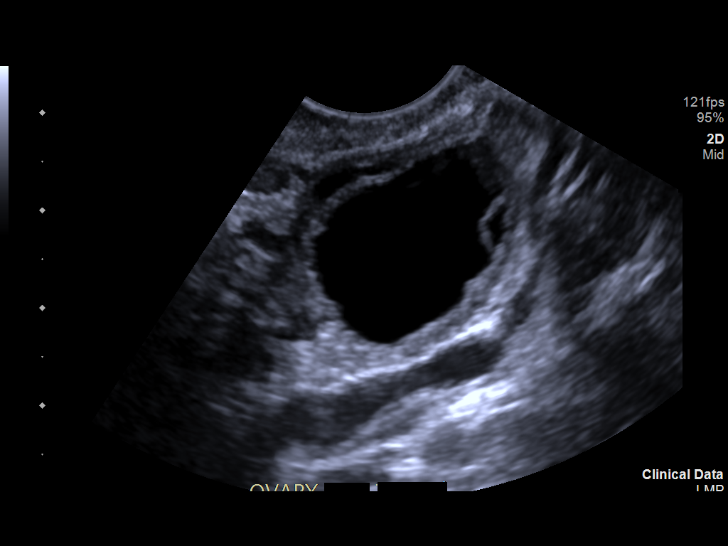
[im 74/81]
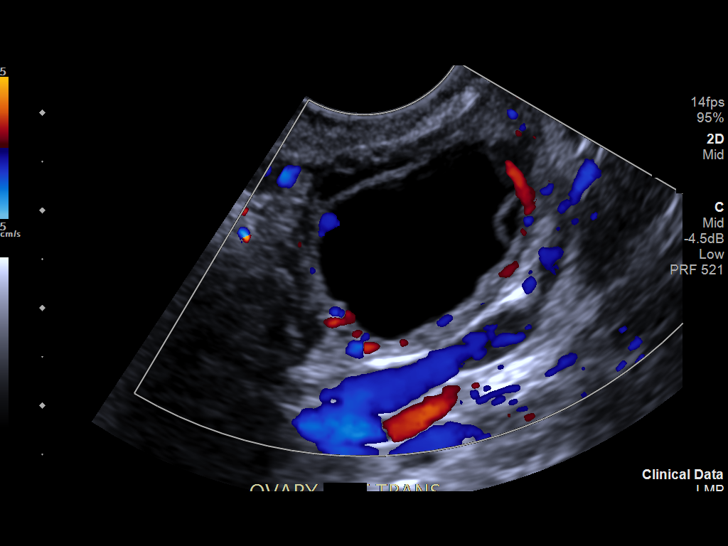
[im 81/81]
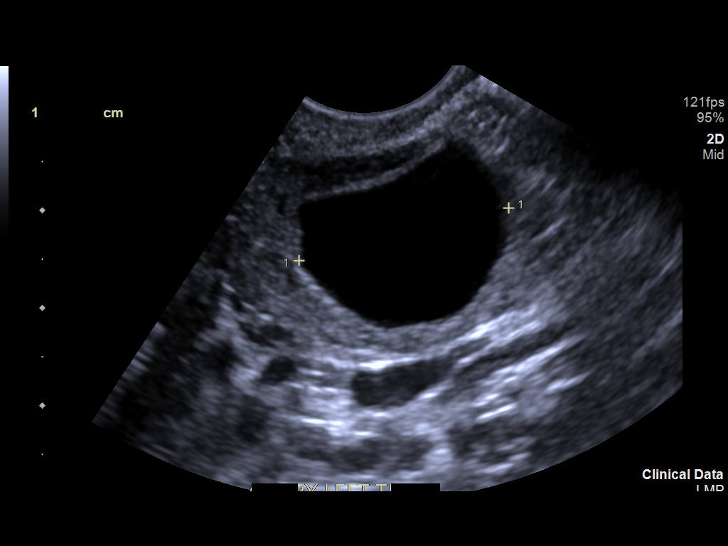

[14 of 25 positions shown; findings below may reference images not displayed]

FINDINGS: Uterus

Measurements: 5.9 x 3.4 x 3.8 cm = volume: 40 mL. No fibroids or
other mass visualized.

Endometrium

Thickness: 9.6 mm. A small amount of fluid in the lower uterine
segment is of doubtful significance in a patient of this age.

Right ovary

Measurements: 3.0 x 2.0 x 3.3 cm = volume: 10.3 mL. Normal
appearance/no adnexal mass.

Left ovary

Measurements: 3.4 x 2.1 x 2.7 cm = volume: 9.9 mL. Normal
appearance/no adnexal mass.

Other findings:  No abnormal free fluid
IMPRESSION: No cause for the patient's symptoms identified. The uterus,
endometrium, and ovaries are unremarkable.
